# Patient Record
Sex: Female | Born: 2000 | Race: White | Hispanic: No | Marital: Single
Health system: Southern US, Community
[De-identification: ages and names within clinical notes are randomized; demographics above are authoritative.]

---
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
## 2021-01-13 ENCOUNTER — Emergency Department (HOSPITAL_BASED_OUTPATIENT_CLINIC_OR_DEPARTMENT_OTHER)
Admission: EM | Admit: 2021-01-13 | Discharge: 2021-01-13 | Disposition: A | Discharge status: Home / Self Care | Payer: BC Managed Care – PPO | Attending: Emergency Medicine | Admitting: Emergency Medicine

## 2021-01-13 ENCOUNTER — Encounter (HOSPITAL_BASED_OUTPATIENT_CLINIC_OR_DEPARTMENT_OTHER): Payer: Self-pay

## 2021-01-13 ENCOUNTER — Emergency Department (HOSPITAL_BASED_OUTPATIENT_CLINIC_OR_DEPARTMENT_OTHER): Payer: BC Managed Care – PPO

## 2021-01-13 ENCOUNTER — Other Ambulatory Visit: Payer: Self-pay

## 2021-01-13 DIAGNOSIS — R509 Fever, unspecified: Secondary | ICD-10-CM | POA: Diagnosis present

## 2021-01-13 DIAGNOSIS — Z20822 Contact with and (suspected) exposure to covid-19: Secondary | ICD-10-CM | POA: Diagnosis not present

## 2021-01-13 DIAGNOSIS — R000 Tachycardia, unspecified: Secondary | ICD-10-CM | POA: Insufficient documentation

## 2021-01-13 DIAGNOSIS — J101 Influenza due to other identified influenza virus with other respiratory manifestations: Secondary | ICD-10-CM | POA: Diagnosis not present

## 2021-01-13 LAB — RESP PANEL BY RT-PCR (FLU A&B, COVID) ARPGX2
Influenza A by PCR: POSITIVE — AB
Influenza B by PCR: NEGATIVE
SARS Coronavirus 2 by RT PCR: NEGATIVE

## 2021-01-13 MED ORDER — ACETAMINOPHEN 500 MG PO TABS
1000.0000 mg | ORAL_TABLET | Freq: Once | ORAL | Status: AC
  Administered 2021-01-13: 1000 mg via ORAL
  Filled 2021-01-13: qty 2

## 2021-01-13 NOTE — ED Triage Notes (Signed)
Reports cough, congestion for a couple days. Fever 102 today. Last motrin 30 min PTA. States she has been in contact with someone who tested positive for Flu.

## 2021-01-13 NOTE — ED Provider Notes (Signed)
[facility] EMERGENCY DEPARTMENT Provider Note   CSN: [id number] Arrival date & time: 01/13/21  2109     History Chief Complaint  Patient presents with  . Fever    Wanda Page is a 20 y.o. female.  20 year old female with history of exercise-induced asthma who presents with cough, congestion, and fever.  Yesterday she began having a cough associated with nasal congestion, runny nose, sore throat, and body aches.  Today she began running a fever up to 102.  She has been taking over-the-counter cold medications such as DayQuil as well as Motrin which she took just prior to arrival.  No vomiting, diarrhea, or urinary symptoms.  She did feel more short of breath earlier today.  She has not used any inhaler at home.  She reports recent contact with someone who tested positive for influenza.  The history is provided by the patient.  Fever      History reviewed. No pertinent past medical history.  There are no problems to display for this patient.   History reviewed. No pertinent surgical history.   OB History   No obstetric history on file.     History reviewed. No pertinent family history.     Home Medications Prior to Admission medications   Not on File    Allergies    Patient has no known allergies.  Review of Systems   Review of Systems  Constitutional: Positive for fever.   All other systems reviewed and are negative except that which was mentioned in HPI  Physical Exam Updated Vital Signs BP 117/71 (BP Location: Left Arm)   Pulse (!) 124   Temp 99.6 F (37.6 C) (Oral)   Resp 16   LMP 01/06/2021 (Approximate)   SpO2 98%   Physical Exam Constitutional:      General: She is not in acute distress.    Appearance: Normal appearance.  HENT:     Head: Normocephalic and atraumatic.  Eyes:     Conjunctiva/sclera: Conjunctivae normal.  Cardiovascular:     Rate and Rhythm: Normal rate and regular rhythm.     Heart sounds: Normal heart sounds.  No murmur heard.   Pulmonary:     Effort: Pulmonary effort is normal.     Breath sounds: Normal breath sounds. No wheezing or rhonchi.  Abdominal:     General: Abdomen is flat. Bowel sounds are normal. There is no distension.     Palpations: Abdomen is soft.     Tenderness: There is no abdominal tenderness.  Musculoskeletal:     Right lower leg: No edema.     Left lower leg: No edema.  Skin:    General: Skin is warm and dry.  Neurological:     Mental Status: She is alert and oriented to person, place, and time.     Comments: fluent  Psychiatric:        Mood and Affect: Mood normal.        Behavior: Behavior normal.     ED Results / Procedures / Treatments   Labs (all labs ordered are listed, but only abnormal results are displayed) Labs Reviewed  RESP PANEL BY RT-PCR (FLU A&B, COVID) [id number]    EKG None  Radiology No results found.  Procedures Procedures   Medications Ordered in ED Medications  acetaminophen (TYLENOL) tablet 1,000 mg (1,000 mg Oral Given 01/13/21 2232)    ED Course  I have reviewed the triage vital signs and the nursing notes.  Pertinent labs & imaging results  that were available during my care of the patient were reviewed by me and considered in my medical decision making (see chart for details).    MDM Rules/Calculators/A&P                          Comfortable and well-appearing on exam, borderline febrile at 99.6 and mild tachycardia but normal O2 saturations on room air.  Normal work of breathing and clear breath sounds, no wheezing.  Influenza A+. CXR  Clear.  Discussed supportive measures including continued hydration, Tylenol/Motrin as needed, over-the-counter cold medications to help with cough, and isolation at home until fever free and improving symptoms.  Encouraged to use inhaler as needed.  Because she is not wheezing currently, do not feel she needs steroids at this time.  I have extensively reviewed return precautions with her and  she voiced understanding. Final Clinical Impression(s) / ED Diagnoses Final diagnoses:  None    Rx / DC Orders ED Discharge Orders    None       Rillie Riffel, Ambrose [place], MD 01/13/21 2317

## 2021-11-28 IMAGING — DX DG CHEST 1V PORT
1 series · 1 of 1 positions shown · non-contrast
Comparison: October 14, 2004

CLINICAL DATA: Cough fever shortness of breath

EXAM:
PORTABLE CHEST 1 VIEW

[chest ap]
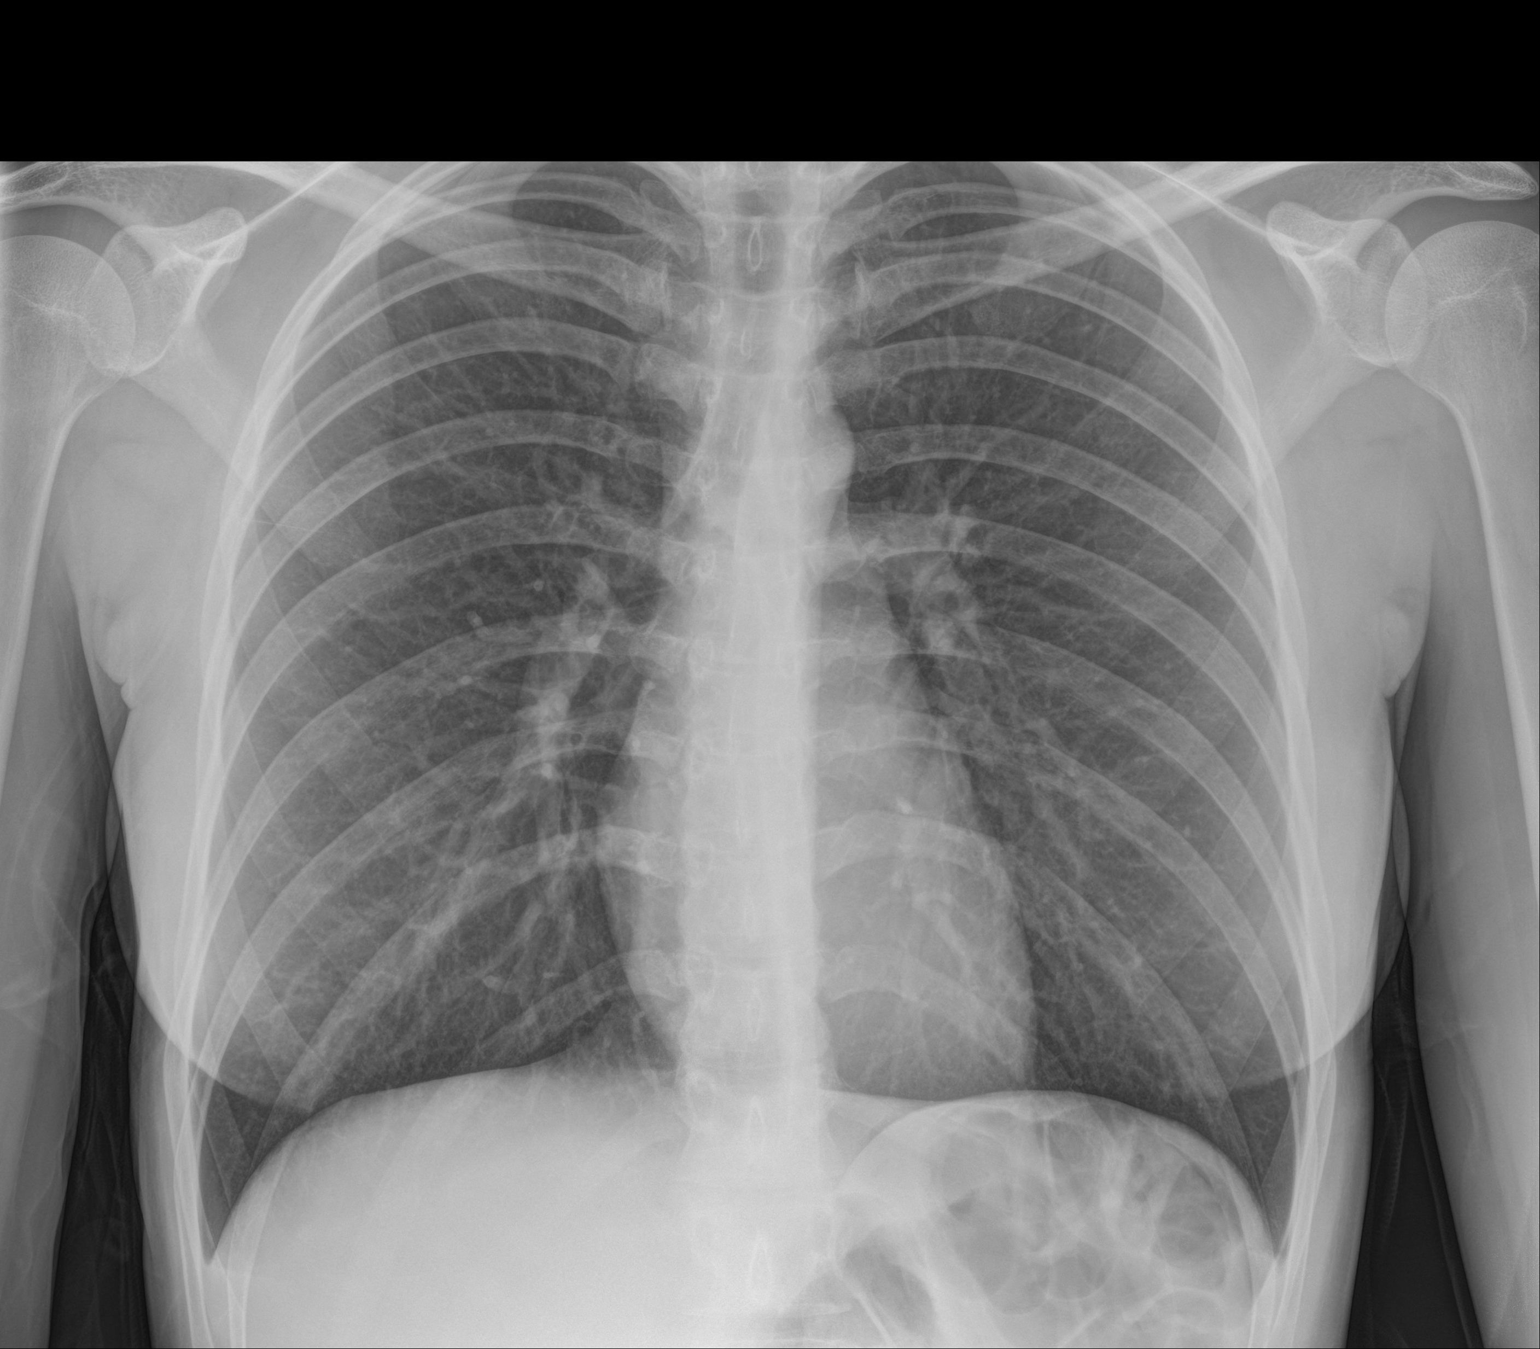

[1 of 1 positions shown; findings below may reference images not displayed]

FINDINGS: The heart size and mediastinal contours are within normal limits.
Both lungs are clear. The visualized skeletal structures are
unremarkable.
IMPRESSION: No acute cardiopulmonary disease.
